# Patient Record
Sex: Male | Born: 1990 | Race: Black or African American | Hispanic: No | Marital: Single | Smoking: Never smoker
Health system: Southern US, Community
[De-identification: ages and names within clinical notes are randomized; demographics above are authoritative.]

---
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
## 2018-02-22 ENCOUNTER — Encounter (HOSPITAL_COMMUNITY): Payer: Self-pay | Admitting: Emergency Medicine

## 2018-02-22 ENCOUNTER — Other Ambulatory Visit: Payer: Self-pay

## 2018-02-22 ENCOUNTER — Emergency Department (HOSPITAL_COMMUNITY)
Admission: EM | Admit: 2018-02-22 | Discharge: 2018-02-22 | Disposition: A | Discharge status: Home / Self Care | Payer: BLUE CROSS/BLUE SHIELD | Attending: Emergency Medicine | Admitting: Emergency Medicine

## 2018-02-22 DIAGNOSIS — J029 Acute pharyngitis, unspecified: Secondary | ICD-10-CM | POA: Diagnosis not present

## 2018-02-22 LAB — GROUP A STREP BY PCR: Group A Strep by PCR: NOT DETECTED

## 2018-02-22 MED ORDER — DEXAMETHASONE SODIUM PHOSPHATE 10 MG/ML IJ SOLN
10.0000 mg | Freq: Once | INTRAMUSCULAR | Status: AC
  Administered 2018-02-22: 10 mg via INTRAMUSCULAR
  Filled 2018-02-22: qty 1

## 2018-02-22 NOTE — ED Provider Notes (Signed)
` [facility]   CSN: [id number] Arrival date & time: 02/22/18  0759     History   Chief Complaint Chief Complaint  Patient presents with  . Sore Throat    HPI Frank Mooney is a 27 y.o. male without significant past medical history who presents to the emergency department with complaints of sore throat for the past 3 days.  Patient states he is been having constant, progressively worsening pain to the throat.  Current discomfort is a 7 out of 10 in severity, worse with swallowing but he is able to swallow.  He has tried over-the-counter throat lozenges at home without significant change.  Reports subjective fevers at home.  Denies cough, congestion, vomiting, dyspnea, change in voice, drooling, or swelling beneath the tongue.  HPI  History reviewed. No pertinent past medical history.  There are no active problems to display for this patient.   History reviewed. No pertinent surgical history.      Home Medications    Prior to Admission medications   Not on File    Family History No family history on file.  Social History Social History   Tobacco Use  . Smoking status: Never Smoker  . Smokeless tobacco: Never Used  Substance Use Topics  . Alcohol use: Yes  . Drug use: Not Currently     Allergies   Patient has no allergy information on record.   Review of Systems Review of Systems  Constitutional: Positive for fever (subjective).  HENT: Positive for sore throat and trouble swallowing (painful, but able). Negative for congestion.   Respiratory: Negative for cough and shortness of breath.   Cardiovascular: Negative for chest pain.  Gastrointestinal: Negative for vomiting.    Physical Exam Updated Vital Signs BP 130/81 (BP Location: Right Arm)   Pulse 92   Temp 99.2 F (37.3 C) (Oral)   Resp 20   Ht 5\' 11"  (1.803 m)   Wt 108.9 kg   SpO2 95%   BMI 33.47 kg/m   Physical Exam  Constitutional:  He appears well-developed and well-nourished.  Non-toxic appearance. No distress.  HENT:  Head: Normocephalic and atraumatic.  Right Ear: Tympanic membrane is not perforated, not erythematous, not retracted and not bulging.  Left Ear: Tympanic membrane is not perforated, not erythematous, not retracted and not bulging.  Nose: Nose normal.  Mouth/Throat: Uvula is midline. Posterior oropharyngeal erythema present. No oropharyngeal exudate. Tonsils are 2+ on the right. Tonsils are 2+ on the left.  Posterior oropharynx is symmetric appearing. Patient tolerating own secretions without difficulty. No trismus. No drooling. No hot potato voice. No swelling beneath the tongue, submandibular compartment is soft.    Eyes: Conjunctivae are normal. Right eye exhibits no discharge. Left eye exhibits no discharge.  Neck: Normal range of motion. Neck supple.  Cardiovascular: Normal rate and regular rhythm.  No murmur heard. Pulmonary/Chest: Effort normal and breath sounds normal. No stridor. No respiratory distress. He has no wheezes.  Lymphadenopathy:    He has cervical adenopathy (mild bilateral anterior).  Neurological: He is alert.  Psychiatric: He has a normal mood and affect. His behavior is normal. Thought content normal.  Nursing note and vitals reviewed.    ED Treatments / Results  Labs (all labs ordered are listed, but only abnormal results are displayed) Labs Reviewed  GROUP A STREP BY PCR    EKG None  Radiology No results found.  Procedures Procedures (including critical care time)  Medications Ordered  in ED Medications  dexamethasone (DECADRON) injection 10 mg (10 mg Intramuscular Given 02/22/18 0931)     Initial Impression / Assessment and Plan / ED Course  I have reviewed the triage vital signs and the nursing notes.  Pertinent labs & imaging results that were available during my care of the patient were reviewed by me and considered in my medical decision making (see  chart for details).   Patient presents with complaint of sore throat.  Patient is nontoxic-appearing, vitals are within normal limits.  On exam patient with tonsillar erythema, 2+ symmetric tonsils with mild anterior cervical lymphadenopathy.  Patient strep test is negative. Exam non concerning for PTA or RPA, there is no trismus, uvular deviation, or hot potato voice. Patient is tolerating his own secretions without difficulty, full ROM of the neck, submandibular compartment is soft.  Suspect viral etiology at this time.  Provided therapeutic Decadron IM in the emergency department.  Recommended use of Tylenol and Ibuprofen for any continued discomfort or fevers. I discussed results, treatment plan, need for PCP follow-up, and return precautions with the patient. Provided opportunity for questions, patient confirmed understanding and is in agreement with plan.   Final Clinical Impressions(s) / ED Diagnoses   Final diagnoses:  Sore throat    ED Discharge Orders    None       Cherly Anderson, PA-C 02/22/18 0865    Tegeler, Canary Brim, MD 02/22/18 [phone]

## 2018-02-22 NOTE — Discharge Instructions (Addendum)
You were seen in the emergency department today for a sore throat.  Your strep test was negative.  You were given Decadron, steroid, in the emergency department to help with pain and swelling.  Given that your strep test was negative, at this time we suspect this is a viral condition.  We would like you to treat this supportively at home with Motrin and/or Tylenol per over-the-counter dosing instructions.    Please follow-up with your primary care provider within 5 days if your symptoms have not improved, return to the ER sooner for new or worsening symptoms including but not limited to inability to swallow, inability to keep fluids down, trouble moving your neck/opening your mouth, or any other concerns you may have.

## 2018-02-22 NOTE — ED Triage Notes (Signed)
Pt. Stated, Frank Mooney had a sore throat for 3 days.

## 2018-05-16 ENCOUNTER — Encounter (HOSPITAL_COMMUNITY): Payer: Self-pay

## 2018-05-16 ENCOUNTER — Emergency Department (HOSPITAL_COMMUNITY)
Admission: EM | Admit: 2018-05-16 | Discharge: 2018-05-16 | Disposition: A | Discharge status: Home / Self Care | Payer: BLUE CROSS/BLUE SHIELD | Attending: Emergency Medicine | Admitting: Emergency Medicine

## 2018-05-16 ENCOUNTER — Other Ambulatory Visit: Payer: Self-pay

## 2018-05-16 DIAGNOSIS — J020 Streptococcal pharyngitis: Secondary | ICD-10-CM | POA: Insufficient documentation

## 2018-05-16 LAB — MONONUCLEOSIS SCREEN: Mono Screen: NEGATIVE

## 2018-05-16 LAB — GROUP A STREP BY PCR: Group A Strep by PCR: DETECTED — AB

## 2018-05-16 MED ORDER — DEXAMETHASONE SODIUM PHOSPHATE 10 MG/ML IJ SOLN
10.0000 mg | Freq: Once | INTRAMUSCULAR | Status: AC
  Administered 2018-05-16: 10 mg via INTRAMUSCULAR
  Filled 2018-05-16: qty 1

## 2018-05-16 MED ORDER — PENICILLIN G BENZATHINE 1200000 UNIT/2ML IM SUSP
1.2000 10*6.[IU] | Freq: Once | INTRAMUSCULAR | Status: AC
  Administered 2018-05-16: 1.2 10*6.[IU] via INTRAMUSCULAR
  Filled 2018-05-16: qty 2

## 2018-05-16 MED ORDER — IBUPROFEN 400 MG PO TABS
600.0000 mg | ORAL_TABLET | Freq: Once | ORAL | Status: AC
  Administered 2018-05-16: 600 mg via ORAL
  Filled 2018-05-16: qty 1

## 2018-05-16 NOTE — ED Notes (Signed)
Patient verbalizes understanding of discharge instructions. Opportunity for questioning and answers were provided. Armband removed by staff, pt discharged from ED.  

## 2018-05-16 NOTE — Discharge Instructions (Signed)
You were treated for strep throat with penicillin in the emergency department today.  You will be contagious for the next 24 hours.  Make sure you get a new toothbrush after tomorrow.  Alternate 600 mg of ibuprofen and [phone] mg of Tylenol every 3 hours as needed for pain and fever. Do not exceed 4000 mg of Tylenol daily.  Take ibuprofen with food to avoid upset stomach issues.  Drink plenty of water and get plenty of rest.  Use warm water salt gargles, warm teas, honey, Chloraseptic spray over-the-counter, and throat lozenges for management of your sore throat pain.  Follow-up with your PCP in 3 to 5 days if symptoms persist.  Return to the emergency department immediately for any concerning signs or symptoms develop such as fever not controlled by medication, persistent vomiting, throat tightness, drooling.

## 2018-05-16 NOTE — ED Provider Notes (Addendum)
[facility] Provider Note   CSN: [id number] Arrival date & time: 05/16/18  [id number]     History   Chief Complaint Chief Complaint  Patient presents with  . Sore Throat    HPI Frank Mooney is a 27 y.o. male with no significant past medical history presents today for evaluation of acute onset, progressively worsening sore throat for 3 days.  He reports associated bilateral ear pain, mild nasal congestion, fevers and chills.  Endorses pain with swallowing but denies choking or voice change.  Has been taking DayQuil, NyQuil, throat spray, and TheraFlu with mild improvement in symptoms.  Denies shortness of breath or chest pain.  The history is provided by the patient.    History reviewed. No pertinent past medical history.  There are no active problems to display for this patient.   History reviewed. No pertinent surgical history.      Home Medications    Prior to Admission medications   Not on File    Family History History reviewed. No pertinent family history.  Social History Social History   Tobacco Use  . Smoking status: Never Smoker  . Smokeless tobacco: Never Used  Substance Use Topics  . Alcohol use: Yes  . Drug use: Not Currently     Allergies   Patient has no known allergies.   Review of Systems Review of Systems  Constitutional: Positive for chills and fever.  HENT: Positive for sore throat.   Respiratory: Negative for shortness of breath.   Cardiovascular: Negative for chest pain.  All other systems reviewed and are negative.    Physical Exam Updated Vital Signs BP (!) 137/95 (BP Location: Right Arm)   Pulse 74   Temp 100.2 F (37.9 C) (Oral)   Resp 16   Ht 6' (1.829 m)   Wt 108.9 kg   SpO2 100%   BMI 32.55 kg/m   Physical Exam  Constitutional: He appears well-developed and well-nourished. He appears ill. No distress.  midly diaphoretic  HENT:  Head: Normocephalic and atraumatic.    Right Ear: Ear canal normal. A middle ear effusion is present.  Left Ear: Ear canal normal. A middle ear effusion is present.  Mouth/Throat: Uvula is midline and mucous membranes are normal. Posterior oropharyngeal erythema present. Tonsils are 3+ on the right. Tonsils are 3+ on the left. Tonsillar exudate.  Nasal septum midline, mild mucosal edema bilaterally.  Normal phonation.  Tolerating secretions without difficulty.  No submandibular or submental swelling.   Eyes: Conjunctivae are normal. Right eye exhibits no discharge. Left eye exhibits no discharge.  Neck: Normal range of motion. Neck supple. No JVD present. No tracheal deviation present.  Bilateral anterior cervical lymphadenopathy  Cardiovascular: Normal rate, regular rhythm and normal heart sounds.  Pulmonary/Chest: Effort normal and breath sounds normal.  Abdominal: Soft. Bowel sounds are normal. He exhibits no distension.  Musculoskeletal: He exhibits no edema.  Lymphadenopathy:    He has cervical adenopathy.  Neurological: He is alert.  Skin: Skin is warm and dry. No erythema.  Psychiatric: He has a normal mood and affect. His behavior is normal.  Nursing note and vitals reviewed.    ED Treatments / Results  Labs (all labs ordered are listed, but only abnormal results are displayed) Labs Reviewed  GROUP A STREP BY PCR - Abnormal; Notable for the following components:      Result Value   Group A Strep by PCR DETECTED (*)    All other components  within normal limits  MONONUCLEOSIS SCREEN    EKG None  Radiology No results found.  Procedures Procedures (including critical care time)  Medications Ordered in ED Medications  penicillin g benzathine (BICILLIN LA) 1200000 UNIT/2ML injection 1.2 Million Units (has no administration in time range)  dexamethasone (DECADRON) injection 10 mg (10 mg Intramuscular Given 05/16/18 1027)  ibuprofen (ADVIL,MOTRIN) tablet 600 mg (600 mg Oral Given 05/16/18 1027)     Initial  Impression / Assessment and Plan / ED Course  I have reviewed the triage vital signs and the nursing notes.  Pertinent labs & imaging results that were available during my care of the patient were reviewed by me and considered in my medical decision making (see chart for details).     Patient with low-grade fever with tonsillar exudate, cervical lymphadenopathy, & dysphagia; strep test positive. Treated in the ED with steroids, NSAIDs, Pain medication and PCN IM.  Pt appears mildly dehydrated, discussed importance of water rehydration. Presentation non concerning for PTA, RPA, or infxn spread to soft tissue.  Airway is intact, no difficulty with phonation or tolerating secretions.  No trismus or uvula deviation.  Recommend follow-up with PCP if symptoms persist.  Discussed strict ED return precautions. Pt verbalized understanding of and agreement with plan and is safe for discharge home at this time.   Final Clinical Impressions(s) / ED Diagnoses   Final diagnoses:  Strep pharyngitis    ED Discharge Orders    None       Jeanie Sewer, PA-C 05/16/18 [address] PA-C 05/16/18 1121    Cathren Laine, MD 05/16/18 1333

## 2018-05-16 NOTE — ED Triage Notes (Signed)
Pt started having sore throat on Thursday that has gradually got worse. Pt having chills and fevers.

## 2018-12-25 ENCOUNTER — Emergency Department (HOSPITAL_COMMUNITY)
Admission: EM | Admit: 2018-12-25 | Discharge: 2018-12-25 | Disposition: A | Discharge status: Home / Self Care | Payer: BC Managed Care – PPO | Attending: Emergency Medicine | Admitting: Emergency Medicine

## 2018-12-25 ENCOUNTER — Other Ambulatory Visit: Payer: Self-pay

## 2018-12-25 ENCOUNTER — Encounter (HOSPITAL_COMMUNITY): Payer: Self-pay | Admitting: Emergency Medicine

## 2018-12-25 DIAGNOSIS — Y9389 Activity, other specified: Secondary | ICD-10-CM | POA: Insufficient documentation

## 2018-12-25 DIAGNOSIS — M62838 Other muscle spasm: Secondary | ICD-10-CM | POA: Insufficient documentation

## 2018-12-25 DIAGNOSIS — Y999 Unspecified external cause status: Secondary | ICD-10-CM | POA: Diagnosis not present

## 2018-12-25 DIAGNOSIS — M545 Low back pain, unspecified: Secondary | ICD-10-CM

## 2018-12-25 DIAGNOSIS — Y92410 Unspecified street and highway as the place of occurrence of the external cause: Secondary | ICD-10-CM | POA: Diagnosis not present

## 2018-12-25 MED ORDER — METHOCARBAMOL 500 MG PO TABS: 500.0000 mg | ORAL_TABLET | Freq: Every evening | ORAL | 0 refills | Status: AC | PRN

## 2018-12-25 MED ORDER — ACETAMINOPHEN 325 MG PO TABS: 650.0000 mg | ORAL_TABLET | Freq: Once | ORAL | Status: DC
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
## 2018-12-28 ENCOUNTER — Emergency Department (HOSPITAL_COMMUNITY)
Admission: EM | Admit: 2018-12-28 | Discharge: 2018-12-28 | Disposition: A | Discharge status: Home / Self Care | Payer: BC Managed Care – PPO | Attending: Emergency Medicine | Admitting: Emergency Medicine

## 2018-12-28 ENCOUNTER — Encounter (HOSPITAL_COMMUNITY): Payer: Self-pay | Admitting: Emergency Medicine

## 2018-12-28 ENCOUNTER — Emergency Department (HOSPITAL_COMMUNITY): Payer: BC Managed Care – PPO

## 2018-12-28 ENCOUNTER — Other Ambulatory Visit: Payer: Self-pay

## 2018-12-28 DIAGNOSIS — Y9389 Activity, other specified: Secondary | ICD-10-CM | POA: Diagnosis not present

## 2018-12-28 DIAGNOSIS — M25511 Pain in right shoulder: Secondary | ICD-10-CM | POA: Insufficient documentation

## 2018-12-28 DIAGNOSIS — Y92410 Unspecified street and highway as the place of occurrence of the external cause: Secondary | ICD-10-CM | POA: Insufficient documentation

## 2018-12-28 DIAGNOSIS — S060X0A Concussion without loss of consciousness, initial encounter: Secondary | ICD-10-CM | POA: Diagnosis not present

## 2018-12-28 DIAGNOSIS — M545 Low back pain, unspecified: Secondary | ICD-10-CM

## 2018-12-28 DIAGNOSIS — Y999 Unspecified external cause status: Secondary | ICD-10-CM | POA: Insufficient documentation

## 2018-12-28 DIAGNOSIS — M542 Cervicalgia: Secondary | ICD-10-CM | POA: Insufficient documentation

## 2018-12-28 MED ORDER — IBUPROFEN 400 MG PO TABS
600.0000 mg | ORAL_TABLET | Freq: Once | ORAL | Status: AC
  Administered 2018-12-28: 600 mg via ORAL
  Filled 2018-12-28: qty 1
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
## 2018-12-28 NOTE — ED Notes (Signed)
Patient verbalizes understanding of discharge instructions. Opportunity for questioning and answers were provided. Armband removed by staff, pt discharged from ED.  
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
## 2022-03-21 ENCOUNTER — Other Ambulatory Visit: Payer: Self-pay

## 2022-03-21 ENCOUNTER — Emergency Department (HOSPITAL_BASED_OUTPATIENT_CLINIC_OR_DEPARTMENT_OTHER)
Admission: EM | Admit: 2022-03-21 | Discharge: 2022-03-21 | Discharge status: Against Medical Advice | Payer: BC Managed Care – PPO | Attending: Emergency Medicine | Admitting: Emergency Medicine

## 2022-03-21 ENCOUNTER — Encounter (HOSPITAL_BASED_OUTPATIENT_CLINIC_OR_DEPARTMENT_OTHER): Payer: Self-pay

## 2022-03-21 DIAGNOSIS — M542 Cervicalgia: Secondary | ICD-10-CM | POA: Insufficient documentation

## 2022-03-21 DIAGNOSIS — R519 Headache, unspecified: Secondary | ICD-10-CM | POA: Insufficient documentation

## 2022-03-21 DIAGNOSIS — I10 Essential (primary) hypertension: Secondary | ICD-10-CM | POA: Insufficient documentation

## 2022-03-21 DIAGNOSIS — Z5321 Procedure and treatment not carried out due to patient leaving prior to being seen by health care provider: Secondary | ICD-10-CM | POA: Insufficient documentation

## 2022-03-21 NOTE — ED Notes (Signed)
Pt called from [place] x3 with no answer.

## 2022-03-21 NOTE — ED Triage Notes (Signed)
Patient here Pov from Home.  Endorses Headache, Neck Pain, Bilateral Shoulder for approximately 2 Weeks. Worsened since. Patient states he believes he has HTN and this a result of that but has not assessed his BP.  No Fall Or Injury. No Fevers. No N/V.   NAD Noted during Triage. A&Ox4. GCS 15. Ambulatory.

## 2022-10-15 ENCOUNTER — Encounter (HOSPITAL_BASED_OUTPATIENT_CLINIC_OR_DEPARTMENT_OTHER): Payer: Self-pay

## 2022-10-15 ENCOUNTER — Other Ambulatory Visit: Payer: Self-pay

## 2022-10-15 ENCOUNTER — Emergency Department (HOSPITAL_BASED_OUTPATIENT_CLINIC_OR_DEPARTMENT_OTHER)
Admission: EM | Admit: 2022-10-15 | Discharge: 2022-10-15 | Disposition: A | Discharge status: Home / Self Care | Payer: Self-pay | Attending: Emergency Medicine | Admitting: Emergency Medicine

## 2022-10-15 DIAGNOSIS — H0012 Chalazion right lower eyelid: Secondary | ICD-10-CM | POA: Insufficient documentation

## 2022-10-15 DIAGNOSIS — J039 Acute tonsillitis, unspecified: Secondary | ICD-10-CM | POA: Insufficient documentation

## 2022-10-15 LAB — GROUP A STREP BY PCR: Group A Strep by PCR: NOT DETECTED

## 2022-10-15 MED ORDER — AMOXICILLIN-POT CLAVULANATE 875-125 MG PO TABS: 1.0000 | ORAL_TABLET | Freq: Two times a day (BID) | ORAL | 0 refills | Status: AC

## 2022-10-15 MED ORDER — NAPROXEN 375 MG PO TABS: 375.0000 mg | ORAL_TABLET | Freq: Two times a day (BID) | ORAL | 0 refills | Status: DC

## 2022-10-15 NOTE — Discharge Instructions (Addendum)
Contact a health care provider if: ?You notice large, tender lumps in your neck that were not there before. ?You have a fever that does not go away after 2-3 days. ?You develop a rash. ?You cough up a green, yellow-brown, or bloody substance. ?You cannot swallow liquids or food for 24 hours. ?Only one of your tonsils is swollen. ?Get help right away if: ?You develop any new symptoms, such as vomiting, severe headache, stiff neck, chest pain, trouble breathing, or trouble swallowing. ?You have severe throat pain along with drooling or voice changes. ?You have severe pain that is not controlled with medicines. ?You cannot fully open your mouth. ?You develop redness, swelling, or severe pain anywhere in your neck. ?

## 2022-10-15 NOTE — ED Triage Notes (Signed)
Patient here POV from Home.  Endorses Sty present to Right Eye for 2 Weeks. Woke up this AM with a Sore Throat and Right Sided Otalgia. No Fever or Cough.   NAD Noted during Triage. A&Ox4. GCS 15. Ambulatory.

## 2022-10-15 NOTE — ED Provider Notes (Signed)
[place] EMERGENCY DEPARTMENT AT [facility] Provider Note   CSN: [id number] Arrival date & time: 10/15/22  1643     History  Chief Complaint  Patient presents with   Sore Throat    Frank Mooney is a 32 y.o. male who presents emergency department chief complaint of stye and sore throat.  He has a history of recurrent tonsillitis.  Patient reports he has had 2 weeks of stye on his left lower eyelid.  Has been progressively worsening he has been using warm compress without relief.  He denies any eye injuries or vision changes.  Patient also noticed swelling in the tonsils worse on the right, he has a swollen lymph node and feels like he has referred pain to his right ear.  He denies fevers or chills.  He has a history of recurrent strep throat.   Sore Throat       Home Medications Prior to Admission medications   Medication Sig Start Date End Date Taking? Authorizing Provider  amoxicillin-clavulanate (AUGMENTIN) 875-125 MG tablet Take 1 tablet by mouth every 12 (twelve) hours. 10/15/22  Yes Kery Haltiwanger, PA-C  naproxen (NAPROSYN) 375 MG tablet Take 1 tablet (375 mg total) by mouth 2 (two) times daily with a meal. 10/15/22  Yes Arthor Captain, PA-C      Allergies    Patient has no known allergies.    Review of Systems   Review of Systems  Physical Exam Updated Vital Signs BP (!) 137/96 (BP Location: Left Arm)   Pulse 75   Temp 98.4 F (36.9 C)   Resp 18   Ht 5\' 11"  (1.803 m)   Wt 113.4 kg   SpO2 100%   BMI 34.87 kg/m  Physical Exam Vitals and nursing note reviewed.  Constitutional:      General: He is not in acute distress.    Appearance: He is well-developed. He is not diaphoretic.  HENT:     Head: Normocephalic and atraumatic.     Right Ear: Hearing, tympanic membrane, ear canal and external ear normal.     Left Ear: Hearing, tympanic membrane, ear canal and external ear normal.     Mouth/Throat:     Pharynx: Uvula midline. Pharyngeal  swelling and posterior oropharyngeal erythema present. No uvula swelling.     Tonsils: No tonsillar exudate. 2+ on the right. 1+ on the left.  Eyes:     General: No scleral icterus.       Right eye: Hordeolum present.     Conjunctiva/sclera: Conjunctivae normal.  Neck:     Comments: Right tonsil or adenopathy Cardiovascular:     Rate and Rhythm: Normal rate and regular rhythm.     Heart sounds: Normal heart sounds.  Pulmonary:     Effort: Pulmonary effort is normal. No respiratory distress.     Breath sounds: Normal breath sounds.  Abdominal:     Palpations: Abdomen is soft.     Tenderness: There is no abdominal tenderness.  Musculoskeletal:     Cervical back: Normal range of motion and neck supple.  Lymphadenopathy:     Cervical: Cervical adenopathy present.  Skin:    General: Skin is warm and dry.  Neurological:     Mental Status: He is alert.  Psychiatric:        Behavior: Behavior normal.     ED Results / Procedures / Treatments   Labs (all labs ordered are listed, but only abnormal results are displayed) Labs Reviewed  GROUP A  STREP BY PCR    EKG None  Radiology No results found.  Procedures Procedures    Medications Ordered in ED Medications - No data to display  ED Course/ Medical Decision Making/ A&P                             Medical Decision Making Severe was sore throat and tonsillitis, no evidence of acute otitis media or otitis externa.  Feel this is referred pain.  Patient also has no evidence of deep space infection of the neck or peritonsillar abscess.  Given the swelling and infection in the right lower eyelid and sore throat will cover for both with Augmentin.  Strep test is pending.  Will also give anti-inflammatory medications.  Discussed outpatient follow-up and referral to ophthalmology.  He appears otherwise appropriate for discharge at this time  Risk Prescription drug management.           Final Clinical Impression(s) / ED  Diagnoses Final diagnoses:  Chalazion of right lower eyelid  Tonsillitis    Rx / DC Orders ED Discharge Orders          Ordered    amoxicillin-clavulanate (AUGMENTIN) 875-125 MG tablet  Every 12 hours        10/15/22 1807    naproxen (NAPROSYN) 375 MG tablet  2 times daily with meals        10/15/22 1807              Arthor Captain, PA-C 10/15/22 1914    Ernie Avena, MD 10/16/22 1207

## 2022-12-13 ENCOUNTER — Encounter (HOSPITAL_BASED_OUTPATIENT_CLINIC_OR_DEPARTMENT_OTHER): Payer: Self-pay | Admitting: Emergency Medicine

## 2022-12-13 ENCOUNTER — Other Ambulatory Visit: Payer: Self-pay

## 2022-12-13 ENCOUNTER — Emergency Department (HOSPITAL_BASED_OUTPATIENT_CLINIC_OR_DEPARTMENT_OTHER): Payer: 59

## 2022-12-13 ENCOUNTER — Emergency Department (HOSPITAL_BASED_OUTPATIENT_CLINIC_OR_DEPARTMENT_OTHER)
Admission: EM | Admit: 2022-12-13 | Discharge: 2022-12-13 | Disposition: A | Discharge status: Home / Self Care | Payer: 59 | Attending: Emergency Medicine | Admitting: Emergency Medicine

## 2022-12-13 DIAGNOSIS — Z23 Encounter for immunization: Secondary | ICD-10-CM | POA: Diagnosis not present

## 2022-12-13 DIAGNOSIS — W260XXA Contact with knife, initial encounter: Secondary | ICD-10-CM | POA: Diagnosis not present

## 2022-12-13 DIAGNOSIS — S61217A Laceration without foreign body of left little finger without damage to nail, initial encounter: Secondary | ICD-10-CM | POA: Diagnosis not present

## 2022-12-13 DIAGNOSIS — S6992XA Unspecified injury of left wrist, hand and finger(s), initial encounter: Secondary | ICD-10-CM | POA: Diagnosis present

## 2022-12-13 MED ORDER — BACITRACIN ZINC 500 UNIT/GM EX OINT: 1.0000 | TOPICAL_OINTMENT | Freq: Two times a day (BID) | CUTANEOUS | 0 refills | Status: AC

## 2022-12-13 MED ORDER — BACITRACIN ZINC 500 UNIT/GM EX OINT
TOPICAL_OINTMENT | Freq: Once | CUTANEOUS | Status: AC
  Administered 2022-12-13: 1 via TOPICAL
  Filled 2022-12-13: qty 28.35

## 2022-12-13 MED ORDER — TETANUS-DIPHTH-ACELL PERTUSSIS 5-2.5-18.5 LF-MCG/0.5 IM SUSY
0.5000 mL | PREFILLED_SYRINGE | Freq: Once | INTRAMUSCULAR | Status: AC
  Administered 2022-12-13: 0.5 mL via INTRAMUSCULAR
  Filled 2022-12-13: qty 0.5

## 2022-12-13 MED ORDER — IBUPROFEN 600 MG PO TABS: 600.0000 mg | ORAL_TABLET | Freq: Three times a day (TID) | ORAL | 0 refills | Status: AC | PRN

## 2022-12-13 MED ORDER — ACETAMINOPHEN 325 MG PO TABS
650.0000 mg | ORAL_TABLET | Freq: Once | ORAL | Status: AC
  Administered 2022-12-13: 650 mg via ORAL
  Filled 2022-12-13: qty 2

## 2022-12-13 MED ORDER — LIDOCAINE HCL (PF) 1 % IJ SOLN
5.0000 mL | Freq: Once | INTRAMUSCULAR | Status: AC
  Administered 2022-12-13: 5 mL
  Filled 2022-12-13: qty 5

## 2022-12-13 NOTE — ED Provider Notes (Signed)
[place] EMERGENCY DEPARTMENT AT [facility] Provider Note   CSN: [id number] Arrival date & time: 12/13/22  1830     History  Chief Complaint  Patient presents with   Laceration    Frank Mooney is a 32 y.o. male with no significant past medical history presents to the ED today for left finger laceration. He was cutting hamburger meat earlier today when he sliced his left pinky finger on a knife. The bleeding has stopped by the time of examination. He maintains full range of motion and sensation to the finger. No numbness, tingling, or coolness to touch of the digit. Patient does not believe he is up to date on his Tetanus vaccine. No other complaints or concerns at this time.    Home Medications Prior to Admission medications   Medication Sig Start Date End Date Taking? Authorizing Provider  bacitracin ointment Apply 1 Application topically 2 (two) times daily for 3 days. Apply thin layer to laceration 12/13/22 12/16/22 Yes Maxwell Marion, PA-C  ibuprofen (ADVIL) 600 MG tablet Take 1 tablet (600 mg total) by mouth every 8 (eight) hours as needed. 12/13/22 01/12/23 Yes Maxwell Marion, PA-C  amoxicillin-clavulanate (AUGMENTIN) 875-125 MG tablet Take 1 tablet by mouth every 12 (twelve) hours. 10/15/22   Arthor Captain, PA-C  naproxen (NAPROSYN) 375 MG tablet Take 1 tablet (375 mg total) by mouth 2 (two) times daily with a meal. 10/15/22   Arthor Captain, PA-C      Allergies    Patient has no known allergies.    Review of Systems   Review of Systems  Musculoskeletal:        Left pinky finger laceration    Physical Exam Updated Vital Signs BP 118/72   Pulse 61   Temp 98 F (36.7 C) (Oral)   Resp 15   SpO2 98%  Physical Exam Vitals and nursing note reviewed.  Constitutional:      Appearance: Normal appearance.  HENT:     Head: Normocephalic and atraumatic.     Mouth/Throat:     Mouth: Mucous membranes are moist.  Eyes:     Conjunctiva/sclera: Conjunctivae normal.      Pupils: Pupils are equal, round, and reactive to light.  Cardiovascular:     Rate and Rhythm: Normal rate and regular rhythm.     Pulses: Normal pulses.  Pulmonary:     Effort: Pulmonary effort is normal.     Breath sounds: Normal breath sounds.  Abdominal:     Palpations: Abdomen is soft.     Tenderness: There is no abdominal tenderness.  Musculoskeletal:        General: Tenderness and signs of injury present. Normal range of motion.     Comments: Patient maintains full ROM of his left hand with strength intact.  Skin:    General: Skin is warm and dry.     Capillary Refill: Capillary refill takes less than 2 seconds.     Findings: Lesion present. No rash.     Comments: 2 cm horizontal laceration to the palmar aspect of left 5th finger just distal to PIP.  Neurological:     General: No focal deficit present.     Mental Status: He is alert.     Sensory: No sensory deficit.     Motor: No weakness.  Psychiatric:        Mood and Affect: Mood normal.        Behavior: Behavior normal.     ED Results / Procedures /  Treatments   Labs (all labs ordered are listed, but only abnormal results are displayed) Labs Reviewed - No data to display  EKG None  Radiology DG Hand 2 View Left  Result Date: 12/13/2022 CLINICAL DATA:  lac EXAM: LEFT HAND - 2 VIEW COMPARISON:  None Available. FINDINGS: There is no evidence of fracture or dislocation. There is no evidence of arthropathy or other focal bone abnormality. Soft tissues are unremarkable. No retained radiopaque foreign body. IMPRESSION: Negative. Electronically Signed   By: Tish Frederickson M.D.   On: 12/13/2022 22:42    Procedures .Marland KitchenLaceration Repair  Date/Time: 12/14/2022 1:38 AM  Performed by: Maxwell Marion, PA-C Authorized by: Maxwell Marion, PA-C   Consent:    Consent obtained:  Verbal   Consent given by:  Patient   Risks discussed:  Infection and pain Universal protocol:    Patient identity confirmed:  Verbally with  patient Anesthesia:    Anesthesia method:  Local infiltration   Local anesthetic:  Lidocaine 1% w/o epi Laceration details:    Location:  Finger   Finger location:  L small finger   Length (cm):  2 Exploration:    Hemostasis achieved with:  Direct pressure   Imaging obtained: x-ray     Imaging outcome: foreign body not noted     Contaminated: no   Treatment:    Area cleansed with:  Shur-Clens   Amount of cleaning:  Standard   Visualized foreign bodies/material removed: no   Skin repair:    Repair method:  Sutures   Suture size:  4-0   Suture material:  Prolene   Suture technique:  Simple interrupted   Number of sutures:  2 Approximation:    Approximation:  Close Post-procedure details:    Dressing:  Antibiotic ointment and non-adherent dressing   Procedure completion:  Tolerated     Medications Ordered in ED Medications  lidocaine (PF) (XYLOCAINE) 1 % injection 5 mL (5 mLs Infiltration Given by Other 12/13/22 2316)  bacitracin ointment (1 Application Topical Given 12/13/22 2325)  acetaminophen (TYLENOL) tablet 650 mg (650 mg Oral Given 12/13/22 2212)  Tdap (BOOSTRIX) injection 0.5 mL (0.5 mLs Intramuscular Given 12/13/22 2212)    ED Course/ Medical Decision Making/ A&P                             Medical Decision Making Amount and/or Complexity of Data Reviewed Radiology: ordered.  Risk OTC drugs. Prescription drug management.   Patient is a 32 year old male with no significant past medical history who presents to the ED today for a left pinky finger laceration after cutting hamburger meat with a new kitchen knife several hours prior to arrival. My differentials include: laceration with vs without foreign body vs tendon involvement. Social determinants of health include: housing, social connection.  On physical exam, the lac is about 2 cm and horizontal. It is on the palmar aspect of the left hand just distal to the PIP. Patient has full range of motion of the digit with  sensation intact. Capillary refill is within 2 seconds.  Left hand x-ray was ordered and interrupted. No foreign body visualized.  Acetaminophen given for pain prior to sutures. Tetanus shot was updated.  I cleaned the laceration and performed a nerve block to the 5th digit on the left hand. Then, 2 simple interrupted sutures were placed with 4-0 Prolene. Bacitracin cream applied on top of sutures, then the finger was wrapped with a  clean dressing.  Instructed to get sutures removed in 7 days. Laceration/suture care instructions provided. Strict return precautions given. Patient stable and safe for discharge home.       Final Clinical Impression(s) / ED Diagnoses Final diagnoses:  Laceration of left little finger without foreign body without damage to nail, initial encounter    Rx / DC Orders ED Discharge Orders          Ordered    bacitracin ointment  2 times daily        12/13/22 2307    ibuprofen (ADVIL) 600 MG tablet  Every 8 hours PRN        12/13/22 2318              Maxwell Marion, PA-C 12/14/22 1610    Terald Sleeper, MD 12/14/22 0930

## 2022-12-13 NOTE — Discharge Instructions (Addendum)
As discussed, your sutures need to be removed in 7 days. Do not go in pools, lakes, or beaches until you get the sutures removed. You can put a light layer of Bacitracin on the wound for the first several days. Take Ibuprofen as needed for swelling and pain. You can alternate with Tylenol for pain every 4 hours as needed.  You can go to your PCP, urgent care, or ED for suture removal. If you do not have a PCP, there will be a phone number on the discharge paperwork you can call to get established with one.  Return to ED if: You have very bad swelling around the wound. Your pain suddenly gets worse and is very bad. You have painful lumps near the wound or on skin anywhere on your body. You have a red streak going away from your wound. The wound is on your hand or foot, and: You cannot move a finger or toe. Your fingers or toes look pale or bluish.

## 2022-12-13 NOTE — ED Provider Notes (Incomplete)
[place] EMERGENCY DEPARTMENT AT [facility] Provider Note   CSN: [id number] Arrival date & time: 12/13/22  1830     History {Add pertinent medical, surgical, social history, OB history to HPI:1} Chief Complaint  Patient presents with  . Laceration    Frank Mooney is a 32 y.o. male with no significant past medical history presents to the ED today for left finger laceration. He was cutting hamburger meat earlier today when he sliced his left pinky finger on a knife.     Home Medications Prior to Admission medications   Medication Sig Start Date End Date Taking? Authorizing Provider  amoxicillin-clavulanate (AUGMENTIN) 875-125 MG tablet Take 1 tablet by mouth every 12 (twelve) hours. 10/15/22   Arthor Captain, PA-C  naproxen (NAPROSYN) 375 MG tablet Take 1 tablet (375 mg total) by mouth 2 (two) times daily with a meal. 10/15/22   Arthor Captain, PA-C      Allergies    Patient has no known allergies.    Review of Systems   Review of Systems  Musculoskeletal:        Left pinky finger laceration    Physical Exam Updated Vital Signs BP 124/87 (BP Location: Right Arm)   Pulse 64   Temp 97.7 F (36.5 C) (Oral)   Resp 18   SpO2 100%  Physical Exam Vitals and nursing note reviewed.  Constitutional:      Appearance: Normal appearance.  HENT:     Head: Normocephalic and atraumatic.     Mouth/Throat:     Mouth: Mucous membranes are moist.  Eyes:     Conjunctiva/sclera: Conjunctivae normal.     Pupils: Pupils are equal, round, and reactive to light.  Cardiovascular:     Rate and Rhythm: Normal rate and regular rhythm.     Pulses: Normal pulses.  Pulmonary:     Effort: Pulmonary effort is normal.     Breath sounds: Normal breath sounds.  Abdominal:     Palpations: Abdomen is soft.     Tenderness: There is no abdominal tenderness.  Musculoskeletal:        General: Tenderness and signs of injury present. Normal range of motion.     Comments: Patient  maintains full ROM of his left hand  Skin:    General: Skin is warm and dry.     Capillary Refill: Capillary refill takes less than 2 seconds.     Findings: Lesion present. No rash.     Comments: 2 cm linear laceration to the volar aspect of left 5th finger just distal to PIP.  Neurological:     General: No focal deficit present.     Mental Status: He is alert.     Sensory: No sensory deficit.     Motor: No weakness.  Psychiatric:        Mood and Affect: Mood normal.        Behavior: Behavior normal.     ED Results / Procedures / Treatments   Labs (all labs ordered are listed, but only abnormal results are displayed) Labs Reviewed - No data to display  EKG None  Radiology DG Hand 2 View Left  Result Date: 12/13/2022 CLINICAL DATA:  lac EXAM: LEFT HAND - 2 VIEW COMPARISON:  None Available. FINDINGS: There is no evidence of fracture or dislocation. There is no evidence of arthropathy or other focal bone abnormality. Soft tissues are unremarkable. No retained radiopaque foreign body. IMPRESSION: Negative. Electronically Signed   By: Normajean Glasgow.D.  On: 12/13/2022 22:42    Procedures Procedures  {Document cardiac monitor, telemetry assessment procedure when appropriate:1}  Medications Ordered in ED Medications  lidocaine (PF) (XYLOCAINE) 1 % injection 5 mL (has no administration in time range)  bacitracin ointment (has no administration in time range)  acetaminophen (TYLENOL) tablet 650 mg (650 mg Oral Given 12/13/22 2212)  Tdap (BOOSTRIX) injection 0.5 mL (0.5 mLs Intramuscular Given 12/13/22 2212)    ED Course/ Medical Decision Making/ A&P   {   Click here for ABCD2, HEART and other calculatorsREFRESH Note before signing :1}                          Medical Decision Making Amount and/or Complexity of Data Reviewed Radiology: ordered.  Risk OTC drugs. Prescription drug management.   ***  {Document critical care time when appropriate:1} {Document review of  labs and clinical decision tools ie heart score, Chads2Vasc2 etc:1}  {Document your independent review of radiology images, and any outside records:1} {Document your discussion with family members, caretakers, and with consultants:1} {Document social determinants of health affecting pt's care:1} {Document your decision making why or why not admission, treatments were needed:1} Final Clinical Impression(s) / ED Diagnoses Final diagnoses:  None    Rx / DC Orders ED Discharge Orders     None

## 2022-12-13 NOTE — ED Triage Notes (Signed)
Pt presents to ED POV. Pt c/o lac to R 5th finger. Pt reports that he accidentally cut it while cooking w/ knife. Tetanus not UTD.

## 2023-05-28 ENCOUNTER — Emergency Department (HOSPITAL_COMMUNITY): Payer: 59

## 2023-05-28 ENCOUNTER — Encounter (HOSPITAL_COMMUNITY): Payer: Self-pay | Admitting: Emergency Medicine

## 2023-05-28 ENCOUNTER — Other Ambulatory Visit: Payer: Self-pay

## 2023-05-28 ENCOUNTER — Emergency Department (HOSPITAL_COMMUNITY)
Admission: EM | Admit: 2023-05-28 | Discharge: 2023-05-29 | Disposition: A | Discharge status: Home / Self Care | Payer: 59 | Attending: Emergency Medicine | Admitting: Emergency Medicine

## 2023-05-28 DIAGNOSIS — Y92410 Unspecified street and highway as the place of occurrence of the external cause: Secondary | ICD-10-CM | POA: Diagnosis not present

## 2023-05-28 DIAGNOSIS — M545 Low back pain, unspecified: Secondary | ICD-10-CM | POA: Diagnosis present

## 2023-05-28 DIAGNOSIS — M25532 Pain in left wrist: Secondary | ICD-10-CM | POA: Diagnosis not present
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
## 2023-05-29 MED ORDER — METHOCARBAMOL 500 MG PO TABS: 500.0000 mg | ORAL_TABLET | Freq: Two times a day (BID) | ORAL | 0 refills | Status: AC

## 2023-05-29 MED ORDER — NAPROXEN 250 MG PO TABS
500.0000 mg | ORAL_TABLET | Freq: Once | ORAL | Status: AC
  Administered 2023-05-29: 500 mg via ORAL
  Filled 2023-05-29: qty 2

## 2023-05-29 MED ORDER — NAPROXEN 500 MG PO TABS: 500.0000 mg | ORAL_TABLET | Freq: Two times a day (BID) | ORAL | 0 refills | Status: AC

## 2023-05-29 MED ORDER — METHOCARBAMOL 500 MG PO TABS
500.0000 mg | ORAL_TABLET | Freq: Once | ORAL | Status: AC
  Administered 2023-05-29: 500 mg via ORAL
  Filled 2023-05-29: qty 1

## 2023-05-29 NOTE — ED Notes (Signed)
Tried to take his vitals, but the pt refused
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
## 2023-05-29 NOTE — Discharge Instructions (Signed)
Take the prescribed medication as directed.  Can use heat/ice as well for added relief if needed. Follow-up with your doctor. Return to the ED for new or worsening symptoms.

## 2024-05-31 ENCOUNTER — Other Ambulatory Visit: Payer: Self-pay

## 2024-05-31 ENCOUNTER — Emergency Department (HOSPITAL_BASED_OUTPATIENT_CLINIC_OR_DEPARTMENT_OTHER)
Admission: EM | Admit: 2024-05-31 | Discharge: 2024-05-31 | Disposition: A | Discharge status: Home / Self Care | Payer: Self-pay | Attending: Emergency Medicine | Admitting: Emergency Medicine

## 2024-05-31 ENCOUNTER — Encounter (HOSPITAL_BASED_OUTPATIENT_CLINIC_OR_DEPARTMENT_OTHER): Payer: Self-pay

## 2024-05-31 DIAGNOSIS — R0981 Nasal congestion: Secondary | ICD-10-CM | POA: Insufficient documentation

## 2024-05-31 DIAGNOSIS — R059 Cough, unspecified: Secondary | ICD-10-CM | POA: Insufficient documentation

## 2024-05-31 LAB — RESP PANEL BY RT-PCR (RSV, FLU A&B, COVID)  RVPGX2
Influenza A by PCR: NEGATIVE
Influenza B by PCR: NEGATIVE
Resp Syncytial Virus by PCR: NEGATIVE
SARS Coronavirus 2 by RT PCR: NEGATIVE

## 2024-05-31 MED ORDER — BENZONATATE 100 MG PO CAPS: 100.0000 mg | ORAL_CAPSULE | Freq: Three times a day (TID) | ORAL | 0 refills | Status: AC

## 2024-05-31 NOTE — ED Provider Notes (Signed)
 " [place] EMERGENCY DEPARTMENT AT [facility] Provider Note   CSN: [id number] Arrival date & time: 05/31/24  1704     Patient presents with: URI   Frank Mooney is a 33 y.o. male.   33 year old male presenting with cough/congestion.  Patient notes onset of symptoms began yesterday, reports he has had a headache with fatigue as well, no known fevers at home.  Reports that he had to leave work early today due to persistent coughing.  Has tried DayQuil with some relief of his symptoms.  No known sick contacts.  Denies chest pain, shortness of breath, abdominal pain/nausea/vomiting.   URI      Prior to Admission medications  Medication Sig Start Date End Date Taking? Authorizing Provider  amoxicillin -clavulanate (AUGMENTIN ) 875-125 MG tablet Take 1 tablet by mouth every 12 (twelve) hours. 10/15/22   Harris, Abigail, PA-C  methocarbamol  (ROBAXIN ) 500 MG tablet Take 1 tablet (500 mg total) by mouth 2 (two) times daily. 05/29/23   Jarold Olam HERO, PA-C  naproxen  (NAPROSYN ) 500 MG tablet Take 1 tablet (500 mg total) by mouth 2 (two) times daily. 05/29/23   Jarold Olam HERO, PA-C    Allergies: Patient has no known allergies.    Review of Systems  Updated Vital Signs  Vitals:   05/31/24 1714  BP: 133/76  Pulse: 81  Resp: 16  Temp: 98.2 F (36.8 C)  SpO2: 100%     Physical Exam Vitals and nursing note reviewed.  HENT:     Head: Normocephalic.     Right Ear: Tympanic membrane normal.     Left Ear: Tympanic membrane normal.     Nose: Congestion present.     Mouth/Throat:     Mouth: Mucous membranes are moist.     Pharynx: No oropharyngeal exudate or posterior oropharyngeal erythema.  Eyes:     Extraocular Movements: Extraocular movements intact.  Cardiovascular:     Rate and Rhythm: Normal rate and regular rhythm.  Pulmonary:     Effort: Pulmonary effort is normal. No respiratory distress.     Breath sounds: Normal breath sounds.  Musculoskeletal:      Cervical back: Normal range of motion.     Comments: Moves all extremities spontaneously without difficulty  Skin:    General: Skin is warm and dry.  Neurological:     Mental Status: He is alert and oriented to person, place, and time.     (all labs ordered are listed, but only abnormal results are displayed) Labs Reviewed  RESP PANEL BY RT-PCR (RSV, FLU A&B, COVID)  [id number]    EKG: None  Radiology: No results found.   Procedures   Medications Ordered in the ED - No data to display                                  Medical Decision Making This patient presents to the ED for concern of cough/congestion, this involves an extensive number of treatment options, and is a complaint that carries with it a high risk of complications and morbidity.  The differential diagnosis includes COVID/flu/RSV, other viral respiratory illness, pneumonia   Lab Tests:  I Ordered, and personally interpreted labs.  The pertinent results include: COVID/flu/RSV negative   Cardiac Monitoring: / EKG:  The patient was maintained on a cardiac monitor.  I personally viewed and interpreted the cardiac monitored which showed an underlying rhythm of: NSR   Problem  List / ED Course / Critical interventions / Medication management I have reviewed the patients home medicines and have made adjustments as needed   Test / Admission - Considered:  Physical exam is largely unremarkable as above, patient does have nasal congestion but oropharynx is without erythema/swelling/exudate, TMs are without bulging/erythema bilaterally, lungs are clear to auscultation bilaterally without adventitious sounds.  Patient tested negative for COVID/flu/RSV, however I do feel that his symptoms today are most reflective of a viral URI.  I recommend that he continue symptomatic measures at home, like Tylenol /ibuprofen  as needed for headache/fever/body ache, nasal saline rinses and Sudafed for nasal congestion.  Will prescribe  Tessalon  perles to be used as needed for cough.  Return precautions discussed, patient voiced understanding is in agreement this plan, he is appropriate for discharge at this time.    Risk Prescription drug management.        Final diagnoses:  Cough, unspecified type  Nasal congestion    ED Discharge Orders          Ordered    benzonatate  (TESSALON ) 100 MG capsule  Every 8 hours        05/31/24 1940               Glendia Rocky SAILOR, [place] 05/31/24 1945  "

## 2024-05-31 NOTE — Discharge Instructions (Addendum)
 Your symptoms today are likely due to a viral respiratory illness.  Continue ibuprofen /Tylenol  as needed for headache/body aches/fever.  You may try nasal saline rinses or medications like Sudafed for relief of your nasal congestion.  I have prescribed you Tessalon , take 1 capsule by mouth every 8 hours as needed for cough.  Follow-up with your PCP as needed.  Return to the emergency department if your symptoms worsen.

## 2024-05-31 NOTE — ED Triage Notes (Signed)
 Pt c/o URI symptoms onset yesterday. Tylenol / dayquil for symptoms
